# Patient Record
Sex: Female | Born: 1970 | Race: White | Hispanic: Yes | Marital: Married | Smoking: Never smoker
Health system: Southern US, Community
[De-identification: ages and names within clinical notes are randomized; demographics above are authoritative.]

## PROBLEM LIST (undated history)

## (undated) HISTORY — PX: TONSILLECTOMY: SUR1361

## (undated) HISTORY — PX: TUBAL LIGATION: SHX77

---

## 2005-08-10 ENCOUNTER — Ambulatory Visit: Payer: Self-pay | Admitting: *Deleted

## 2005-08-10 ENCOUNTER — Other Ambulatory Visit: Admission: RE | Admit: 2005-08-10 | Discharge: 2005-08-10 | Payer: Self-pay | Admitting: *Deleted

## 2005-08-31 ENCOUNTER — Ambulatory Visit: Payer: Self-pay | Admitting: Family Medicine

## 2006-02-22 ENCOUNTER — Ambulatory Visit: Payer: Self-pay | Admitting: Gynecology

## 2011-07-31 ENCOUNTER — Ambulatory Visit: Payer: Self-pay | Admitting: Physical Therapy

## 2011-08-08 ENCOUNTER — Ambulatory Visit: Payer: Self-pay | Attending: Physical Medicine and Rehabilitation | Admitting: Physical Therapy

## 2011-08-08 DIAGNOSIS — M545 Low back pain, unspecified: Secondary | ICD-10-CM | POA: Insufficient documentation

## 2011-08-08 DIAGNOSIS — IMO0001 Reserved for inherently not codable concepts without codable children: Secondary | ICD-10-CM | POA: Insufficient documentation

## 2011-08-13 ENCOUNTER — Ambulatory Visit: Payer: Self-pay | Admitting: Physical Therapy

## 2011-08-14 ENCOUNTER — Ambulatory Visit: Payer: Self-pay | Admitting: Physical Therapy

## 2011-08-16 ENCOUNTER — Ambulatory Visit: Payer: Self-pay | Admitting: Physical Therapy

## 2011-08-20 ENCOUNTER — Ambulatory Visit: Payer: Self-pay | Admitting: Physical Therapy

## 2011-08-22 ENCOUNTER — Ambulatory Visit: Payer: Self-pay | Admitting: Physical Therapy

## 2011-08-23 ENCOUNTER — Ambulatory Visit: Payer: Self-pay | Attending: Physical Medicine and Rehabilitation | Admitting: Physical Therapy

## 2011-08-23 DIAGNOSIS — M545 Low back pain, unspecified: Secondary | ICD-10-CM | POA: Insufficient documentation

## 2011-08-23 DIAGNOSIS — IMO0001 Reserved for inherently not codable concepts without codable children: Secondary | ICD-10-CM | POA: Insufficient documentation

## 2011-08-27 ENCOUNTER — Ambulatory Visit: Payer: Self-pay | Admitting: Physical Therapy

## 2011-08-29 ENCOUNTER — Ambulatory Visit: Payer: Self-pay | Admitting: Physical Therapy

## 2011-08-30 ENCOUNTER — Encounter: Payer: Self-pay | Admitting: Physical Therapy

## 2011-09-03 ENCOUNTER — Ambulatory Visit: Payer: Self-pay | Admitting: Physical Therapy

## 2011-09-05 ENCOUNTER — Ambulatory Visit: Payer: Self-pay | Admitting: Physical Therapy

## 2011-09-06 ENCOUNTER — Encounter: Payer: Self-pay | Admitting: Physical Therapy

## 2011-10-01 ENCOUNTER — Ambulatory Visit: Payer: Self-pay | Attending: Physical Medicine and Rehabilitation | Admitting: Physical Therapy

## 2011-10-01 DIAGNOSIS — M545 Low back pain, unspecified: Secondary | ICD-10-CM | POA: Insufficient documentation

## 2011-10-01 DIAGNOSIS — IMO0001 Reserved for inherently not codable concepts without codable children: Secondary | ICD-10-CM | POA: Insufficient documentation

## 2017-03-15 ENCOUNTER — Other Ambulatory Visit: Payer: Self-pay | Admitting: Obstetrics and Gynecology

## 2017-03-15 DIAGNOSIS — Z1231 Encounter for screening mammogram for malignant neoplasm of breast: Secondary | ICD-10-CM

## 2017-03-28 ENCOUNTER — Ambulatory Visit (HOSPITAL_COMMUNITY): Payer: Self-pay

## 2017-05-09 ENCOUNTER — Ambulatory Visit (HOSPITAL_COMMUNITY)
Admission: RE | Admit: 2017-05-09 | Discharge: 2017-05-09 | Disposition: A | Discharge status: Home / Self Care | Payer: Self-pay | Source: Ambulatory Visit | Attending: Obstetrics and Gynecology | Admitting: Obstetrics and Gynecology

## 2017-05-09 ENCOUNTER — Encounter (HOSPITAL_COMMUNITY): Payer: Self-pay | Admitting: *Deleted

## 2017-05-09 ENCOUNTER — Ambulatory Visit
Admission: RE | Admit: 2017-05-09 | Discharge: 2017-05-09 | Disposition: A | Discharge status: Home / Self Care | Payer: No Typology Code available for payment source | Source: Ambulatory Visit | Attending: Obstetrics and Gynecology | Admitting: Obstetrics and Gynecology

## 2017-05-09 VITALS — BP 102/64 | Temp 98.9°F

## 2017-05-09 DIAGNOSIS — Z1231 Encounter for screening mammogram for malignant neoplasm of breast: Secondary | ICD-10-CM

## 2017-05-09 DIAGNOSIS — Z01419 Encounter for gynecological examination (general) (routine) without abnormal findings: Secondary | ICD-10-CM

## 2017-05-09 NOTE — Patient Instructions (Addendum)
Explained breast self awareness with Natalie Cole. Let patient know [facility] will cover Pap smears and HPV typing every 5 years unless has a history of abnormal Pap smears. [facility]. Appointment scheduled for Thursday, May 09, 2017 at 1440. Let patient know will follow up with her within the next couple weeks with results of Pap smear by phone. Informed patient that the Breast Center will follow up with her within the next couple of weeks with results of mammogram by letter or phone. Natalie Cole verbalized understanding.  Amandeep Nesmith, Kathaleen Maserhristine Poll, RN 2:41 PM

## 2017-05-09 NOTE — Progress Notes (Signed)
No complaints today.   Pap Smear: Pap smear completed today. Last Pap smear was in February 2015 at [facility] and normal per patient. Per patient has a history of an abnormal Pap smear 12 years ago that a colposcopy was completed for follow up. Per patient has had three normal Pap smears since colposcopy. No Pap smear results are in EPIC.  Physical exam: Breasts Breasts symmetrical. No skin abnormalities bilateral breasts. No nipple retraction bilateral breasts. No nipple discharge bilateral breasts. No lymphadenopathy. No lumps palpated bilateral breasts. No complaints of pain or tenderness on exam. [facility]. Appointment scheduled for Thursday, May 09, 2017 at 1440.  Pelvic/Bimanual   Ext Genitalia No lesions, no swelling and no discharge observed on external genitalia.         Vagina Vagina pink and normal texture. No lesions or discharge observed in vagina.          Cervix Cervix is present. Cervix pink and of normal texture. Cervix friable. No discharge observed.     Uterus Uterus is present and palpable. Uterus in normal position and normal size.        Adnexae Bilateral ovaries present and palpable. No tenderness on palpation.          Rectovaginal No rectal exam completed today since patient had no rectal complaints. No skin abnormalities observed on exam.    Smoking History: Patient has never smoked.  Patient Navigation: Patient education provided. Access to services [facility] for [facility]. Spanish interpreter provided.  Used Spanish interpreter [employer] Lind[employer] from [facility].

## 2017-05-10 ENCOUNTER — Encounter (HOSPITAL_COMMUNITY): Payer: Self-pay | Admitting: *Deleted

## 2017-05-10 LAB — CYTOLOGY - PAP
DIAGNOSIS: NEGATIVE
HPV: NOT DETECTED

## 2017-05-17 ENCOUNTER — Encounter (HOSPITAL_COMMUNITY): Payer: Self-pay | Admitting: *Deleted

## 2017-05-17 NOTE — Progress Notes (Signed)
Letter mailed to patient with negative pap smear results. Next pap smear due in five years.  

## 2018-04-17 IMAGING — MG DIGITAL SCREENING BILATERAL MAMMOGRAM WITH CAD
4 series · 4 of 4 positions shown · non-contrast
Comparison: None.

CLINICAL DATA: Screening.

EXAM:
DIGITAL SCREENING BILATERAL MAMMOGRAM WITH CAD

[R CC]
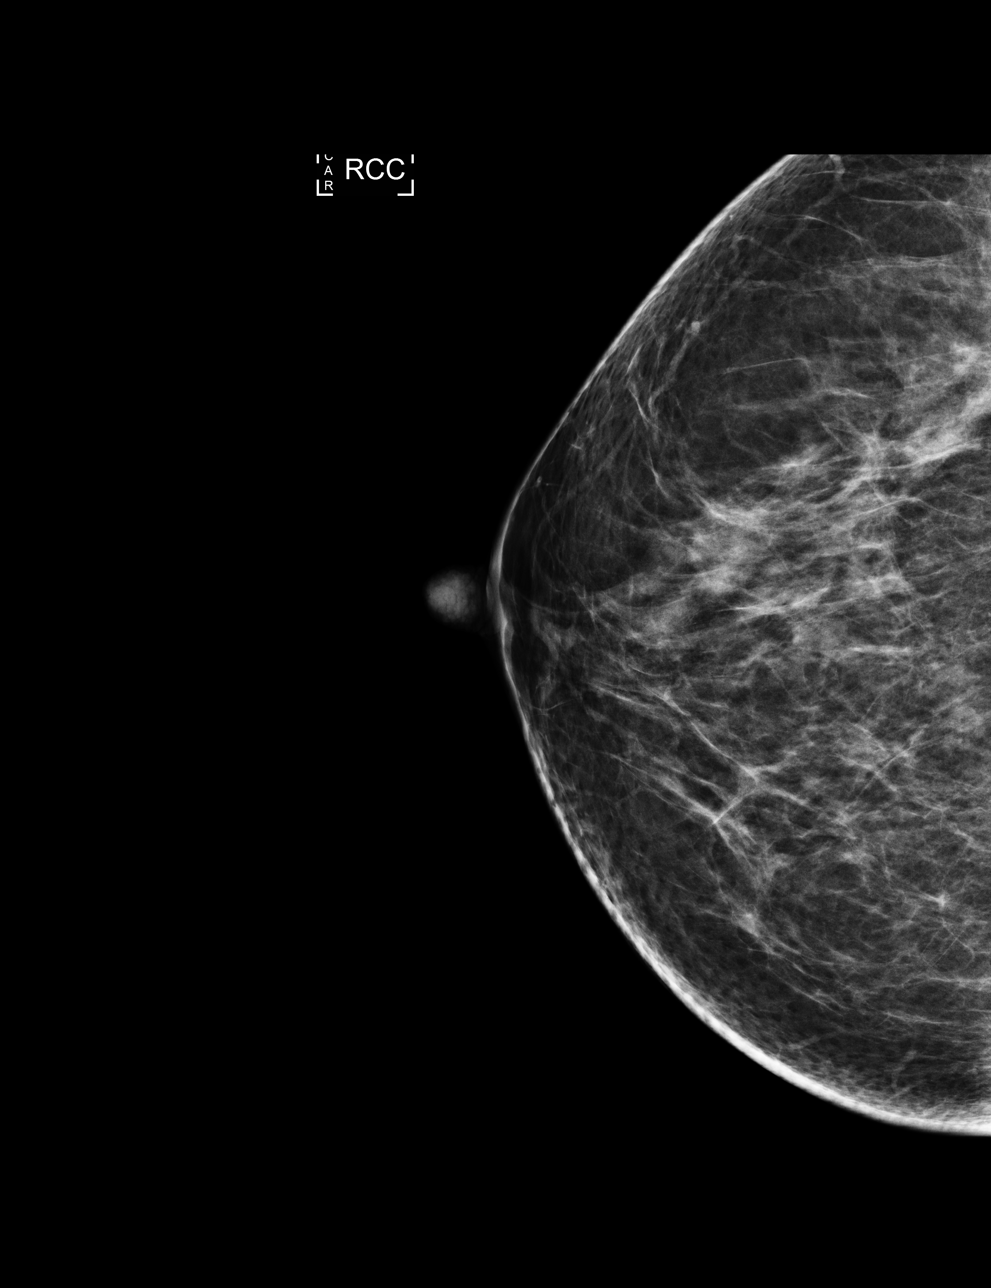

[L MLO]
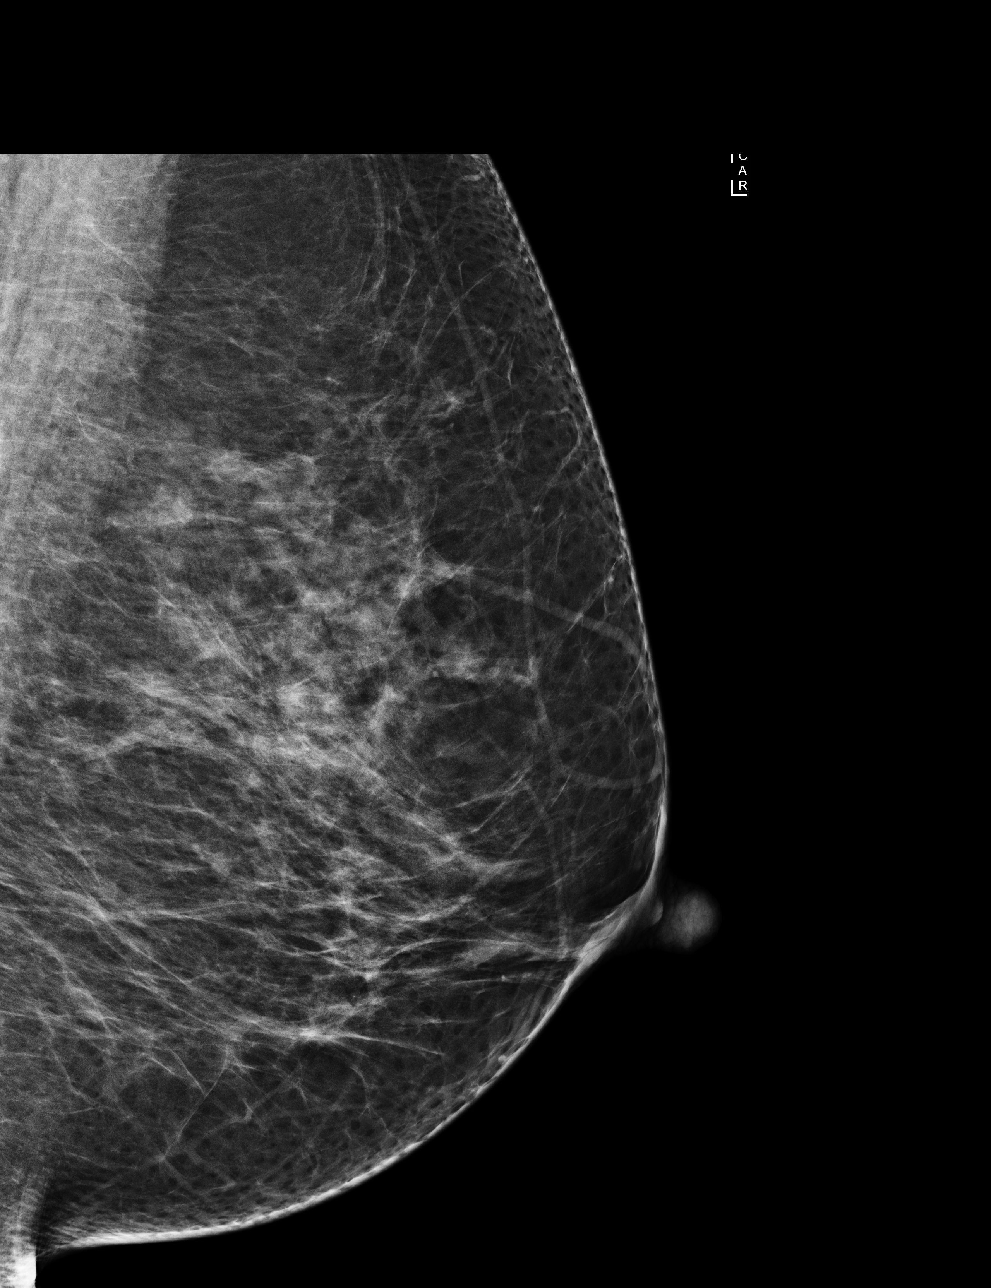

[R MLO]
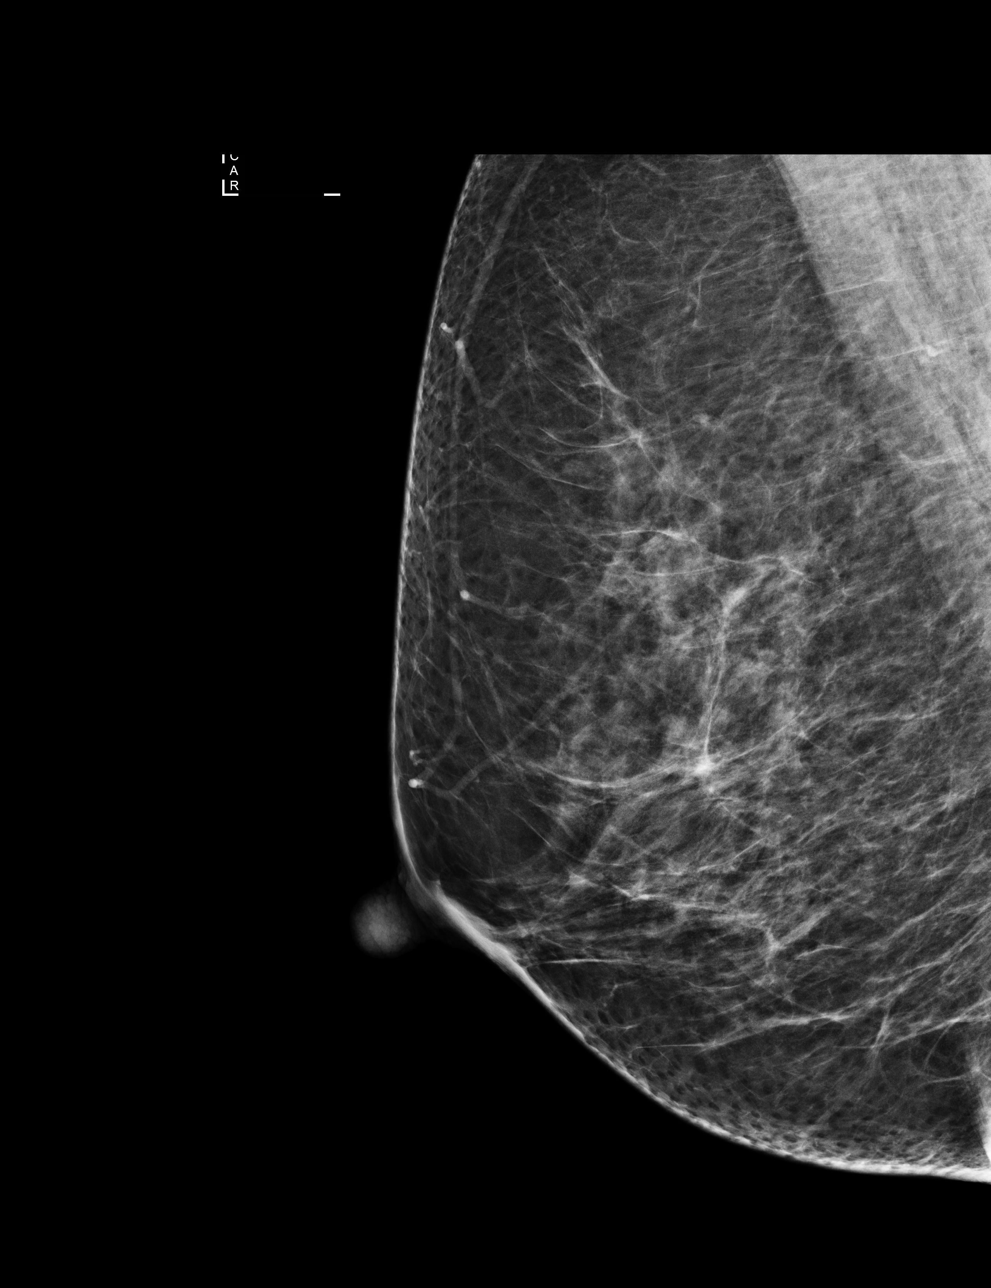

[L CC]
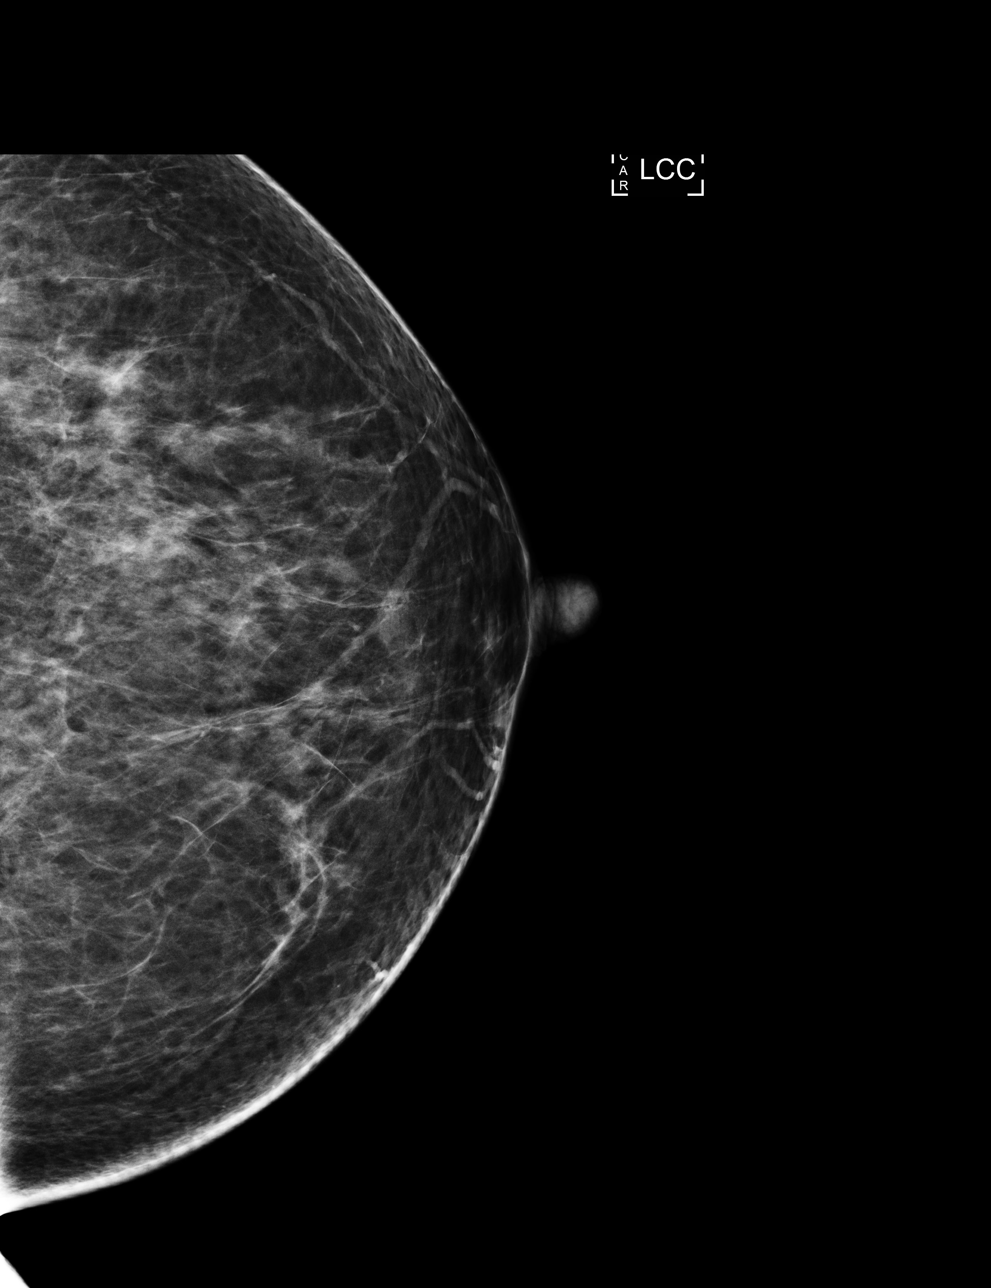

[4 of 4 positions shown; findings below may reference images not displayed]

ACR Breast Density Category c: The breast tissue is heterogeneously
dense, which may obscure small masses
FINDINGS: There are no findings suspicious for malignancy. Images were
processed with CAD.
IMPRESSION: No mammographic evidence of malignancy. A result letter of this
screening mammogram will be mailed directly to the patient.

RECOMMENDATION:
Screening mammogram in one year. (Code:[id number])

BI-RADS CATEGORY  1: Negative.

## 2020-01-21 ENCOUNTER — Ambulatory Visit: Payer: Self-pay | Attending: Internal Medicine

## 2020-01-21 DIAGNOSIS — Z23 Encounter for immunization: Secondary | ICD-10-CM

## 2020-01-21 NOTE — Progress Notes (Signed)
   Covid-19 Vaccination Clinic  Name:  Natalie Cole    MRN: [id number] DOB: November 30, 1970  01/21/2020  [name] was observed post Covid-19 immunization for 15 minutes without incident. She was provided with Vaccine Information Sheet and instruction to access the V-Safe system.   [name] was instructed to call 911 with any severe reactions post vaccine: Marland Kitchen Difficulty breathing  . Swelling of face and throat  . A fast heartbeat  . A bad rash all over body  . Dizziness and weakness   Immunizations Administered    Name Date Dose VIS Date Route   Pfizer COVID-19 Vaccine 01/21/2020  8:17 AM 0.3 mL 10/02/2019 Intramuscular   Manufacturer: [employer], [employer]   Lot: [id number]   NDC: [address]-6

## 2020-02-15 ENCOUNTER — Ambulatory Visit: Payer: No Typology Code available for payment source | Attending: Internal Medicine

## 2020-02-15 DIAGNOSIS — Z23 Encounter for immunization: Secondary | ICD-10-CM

## 2020-02-15 NOTE — Progress Notes (Signed)
   Covid-19 Vaccination Clinic  Name:  Lakrista Scaduto    MRN: [id number] DOB: [birth year]  02/15/2020  Ms. Corp was observed post Covid-19 immunization for 15 minutes without incident. She was provided with Vaccine Information Sheet and instruction to access the V-Safe system.   Ms. Armstead was instructed to call 911 with any severe reactions post vaccine: Marland Kitchen Difficulty breathing  . Swelling of face and throat  . A fast heartbeat  . A bad rash all over body  . Dizziness and weakness   Immunizations Administered    Name Date Dose VIS Date Route   Pfizer COVID-19 Vaccine 02/15/2020  8:20 AM 0.3 mL 12/16/2018 Intramuscular   Manufacturer: [employer], [employer]   Lot: [id number]   NDC: [address]-1
